# Patient Record
Sex: Female | Born: 1980 | Race: White | Hispanic: No | Marital: Married | Smoking: Never smoker
Health system: Southern US, Community
[De-identification: ages and names within clinical notes are randomized; demographics above are authoritative.]

---

## 2006-04-13 ENCOUNTER — Inpatient Hospital Stay (HOSPITAL_COMMUNITY): Admission: AD | Admit: 2006-04-13 | Discharge: 2006-04-16 | Payer: Self-pay | Admitting: Psychiatry

## 2006-04-13 ENCOUNTER — Ambulatory Visit: Payer: Self-pay | Admitting: Psychiatry

## 2007-02-15 ENCOUNTER — Ambulatory Visit: Payer: Self-pay | Admitting: Family Medicine

## 2007-02-27 ENCOUNTER — Ambulatory Visit: Payer: Self-pay | Admitting: Internal Medicine

## 2007-02-27 LAB — CONVERTED CEMR LAB
Alkaline Phosphatase: 59 units/L (ref 39–117)
BUN: 11 mg/dL (ref 6–23)
Eosinophils Absolute: 0 10*3/uL (ref 0.0–0.7)
Eosinophils Relative: 0 % (ref 0–5)
Glucose, Bld: 87 mg/dL (ref 70–99)
HCT: 40.9 % (ref 36.0–46.0)
HDL: 70 mg/dL (ref >39)
Hep B Core Total Ab: NEGATIVE
Hep B S Ab: POSITIVE — AB
LDL Cholesterol: 64 mg/dL (ref 0–99)
Lymphs Abs: 2.8 10*3/uL (ref 0.7–4.0)
MCHC: 31.5 g/dL (ref 30.0–36.0)
MCV: 91.1 fL (ref 78.0–100.0)
Platelets: 346 10*3/uL (ref 150–400)
Sodium: 141 meq/L (ref 135–145)
Total Bilirubin: 0.8 mg/dL (ref 0.3–1.2)
Total Protein: 7.5 g/dL (ref 6.0–8.3)
Triglycerides: 51 mg/dL (ref <150)
VLDL: 10 mg/dL (ref 0–40)
WBC: 8.3 10*3/uL (ref 4.0–10.5)

## 2007-04-08 ENCOUNTER — Ambulatory Visit: Payer: Self-pay | Admitting: Gastroenterology

## 2007-04-08 DIAGNOSIS — B182 Chronic viral hepatitis C: Secondary | ICD-10-CM | POA: Insufficient documentation

## 2007-05-24 ENCOUNTER — Telehealth: Payer: Self-pay | Admitting: Gastroenterology

## 2007-06-04 ENCOUNTER — Encounter: Payer: Self-pay | Admitting: Gastroenterology

## 2007-10-24 ENCOUNTER — Telehealth: Payer: Self-pay | Admitting: Gastroenterology

## 2007-11-28 ENCOUNTER — Ambulatory Visit: Payer: Self-pay | Admitting: Gastroenterology

## 2007-12-19 ENCOUNTER — Ambulatory Visit: Payer: Self-pay | Admitting: Gastroenterology

## 2008-05-07 ENCOUNTER — Ambulatory Visit: Payer: Self-pay | Admitting: Gastroenterology

## 2008-05-21 ENCOUNTER — Ambulatory Visit: Payer: Self-pay | Admitting: Gastroenterology

## 2008-05-21 ENCOUNTER — Encounter: Payer: Self-pay | Admitting: Gastroenterology

## 2008-06-04 ENCOUNTER — Encounter: Payer: Self-pay | Admitting: Gastroenterology

## 2008-06-04 ENCOUNTER — Ambulatory Visit: Payer: Self-pay | Admitting: Gastroenterology

## 2008-07-02 ENCOUNTER — Ambulatory Visit: Payer: Self-pay | Admitting: Gastroenterology

## 2008-08-18 ENCOUNTER — Ambulatory Visit: Payer: Self-pay | Admitting: Gastroenterology

## 2008-09-24 ENCOUNTER — Encounter: Payer: Self-pay | Admitting: Gastroenterology

## 2008-09-24 ENCOUNTER — Ambulatory Visit: Payer: Self-pay | Admitting: Gastroenterology

## 2009-05-11 ENCOUNTER — Other Ambulatory Visit: Admission: RE | Admit: 2009-05-11 | Discharge: 2009-05-11 | Payer: Self-pay | Admitting: Family Medicine

## 2010-03-09 ENCOUNTER — Other Ambulatory Visit: Payer: Self-pay | Admitting: Family Medicine

## 2010-03-09 ENCOUNTER — Ambulatory Visit
Admission: RE | Admit: 2010-03-09 | Discharge: 2010-03-09 | Disposition: A | Discharge status: Home / Self Care | Payer: 59 | Source: Ambulatory Visit | Attending: Family Medicine | Admitting: Family Medicine

## 2010-03-09 DIAGNOSIS — R262 Difficulty in walking, not elsewhere classified: Secondary | ICD-10-CM

## 2010-05-17 ENCOUNTER — Telehealth: Payer: Self-pay | Admitting: Gastroenterology

## 2010-05-17 NOTE — Telephone Encounter (Signed)
Returned pts call and she states that she figured out what she needed. Pt did not explain anything further.

## 2010-05-19 ENCOUNTER — Ambulatory Visit (INDEPENDENT_AMBULATORY_CARE_PROVIDER_SITE_OTHER): Payer: Self-pay | Admitting: Gastroenterology

## 2010-05-19 DIAGNOSIS — B182 Chronic viral hepatitis C: Secondary | ICD-10-CM

## 2010-05-24 ENCOUNTER — Other Ambulatory Visit: Payer: Self-pay | Admitting: Gastroenterology

## 2010-05-31 NOTE — Assessment & Plan Note (Signed)
[facility]                         GASTROENTEROLOGY OFFICE NOTE   NIMRAT, WOOLWORTH                     MRN:          [id number]  DATE:04/08/2007                            DOB:          March 26, 1980    REASON FOR CONSULTATION:  Hepatitis C.   Ms. Donna Bridges is a 30 year old white female referred by the [facility] and by [facility] for evaluation of hepatitis C.  Ms. Donna Bridges was tested for hepatitis because of her history of IV drug  use.  She is currently in a drug rehabilitation program.  She does not  drink.  There is no history of blood transfusions or liver disease.  Recent liver tests were entirely normal.   Past medical history is pertinent for depression and migraine headaches.   FAMILY HISTORY:  Noncontributory.   She is on no medicines.   She has no allergies.   She neither smokes nor drinks.  She is single and unemployed.   REVIEW OF SYSTEMS:  Positive for back pain and pain with her periods.   PHYSICAL EXAMINATION:  Pulse 88, blood pressure 98/44, weight 175.  There is no stigmata of liver disease.  HEENT: EOMI.  PERRLA.  Sclerae are anicteric.  Conjunctivae are pink.  NECK:  Supple without thyromegaly, adenopathy or carotid bruits.  CHEST:  Clear to auscultation and percussion without adventitious  sounds.  CARDIAC:  Regular rhythm; normal S1 S2.  There are no murmurs, gallops  or rubs.  ABDOMEN:  Bowel sounds are normoactive.  Abdomen is soft, nontender and  nondistended.  There are no abdominal masses, tenderness, splenic  enlargement or hepatomegaly.  EXTREMITIES:  Full range of motion.  No cyanosis, clubbing or edema.  RECTAL:  Deferred.   IMPRESSION:  Hepatitis C.  She clearly has no symptoms referable to  this.  At issue is whether she has active viral replication.   RECOMMENDATION:  Check HCV quantitative RNA.  If positive, I will refer  her to the hepatitis C clinic.     Barbette Hair. Arlyce Dice,  MD,FACG  Electronically Signed    RDK/MedQ  DD: 04/08/2007  DT: 04/08/2007  Job #: [id number]   cc:   Melvern Banker

## 2010-05-31 NOTE — Letter (Signed)
April 08, 2007    [employer] Luffman   RE:  RAIZEL, WESOLOWSKI  MRN:  [id number]  /  DOB:  Jan 14, 1981   Dear Ms. Luffman:   It is my pleasure to have treated you recently as a new patient in my  office.  I appreciate your confidence and the opportunity to participate  in your care.   Since I do have a busy inpatient endoscopy schedule and office schedule,  my office hours vary weekly.  I am, however, available for emergency  calls every day through my office.  If I cannot promptly meet an urgent  office appointment, another one of our gastroenterologists will be able  to assist you.   My well-trained staff are prepared to help you at all times.  For  emergencies after office hours, a physician from our gastroenterology  section is always available through my 24-hour answering service.   While you are under my care, I encourage discussion of your questions  and concerns, and I will be happy to return your calls as soon as I am  available.   Once again, I welcome you as a new patient and I look forward to a happy  and healthy relationship.    Sincerely,      Barbette Hair. Arlyce Dice, MD,FACG  Electronically Signed   RDK/MedQ  DD: 04/08/2007  DT: 04/08/2007  Job #: [id number]

## 2010-06-03 NOTE — Discharge Summary (Signed)
[name], Bridges NO.:  [id number]   MEDICAL RECORD NO.:  [id number]          PATIENT TYPE:  IPS   LOCATION:  0508                          FACILITY:  [facility]   PHYSICIAN:  Geoffery Lyons, M.D.      DATE OF BIRTH:  [birth year]   DATE OF ADMISSION:  04/13/2006  DATE OF DISCHARGE:  04/16/2006                               DISCHARGE SUMMARY   CHIEF COMPLAINT AND PRESENT ILLNESS:  This is the first admission to  [facility] for this 30 year old white female  voluntarily committed recently who is requesting detox from opiates,  revealing that the drug use parents were supportive using opiates.  Had  been using opiates for 2 years, on methadone 60 mg per day.  Stopped  methadone and now using OxyContin.  Has had history of IV drug use.  Used cocaine.  History of mood swings related to substance abuse.   PAST PSYCHIATRIC HISTORY:  No actual counseling.  Has been in the  methadone clinic in the past.  Two year history of opiate dependence.   ALCOHOL AND DRUG HISTORY:  Cocaine and marijuana.  Persistent use of  opiates.   MEDICAL HISTORY:  Migraine headaches.   MEDICATIONS:  1. Imitrex 50 mg.  2. Celexa 20 mg per day.  3. Verapamil.   PHYSICAL EXAMINATION:  Physical examination performed, failed to show  any acute findings.   LABORATORY WORK:  CBC:  White blood cells 9.3, hemoglobin 12.9.  Sodium  135, potassium 3.5, glucose 124, SGOT 14, SGPT 21, total bilirubin 0.5.  TSH 1.961.   MENTAL STATUS EXAM:  A fully alert, pleasant, cooperative female,  somewhat disheveled with prominent track marks.  Speech normal rate,  tone and production.  Mood depressed.  Affect depressed.  Thought  processes logical, coherent and relevant.  Endorsed suicidal ideation  planning to wreck her car.  Cognition well preserved.   DIAGNOSIS:  AXIS I: Opiate dependence, cocaine abuse, substance-induced  mood disorder.  AXIS II:  No diagnosis.  AXIS III: Migraine  headaches.  AXIS IV: Moderate.  AXIS V: Upon admission 35.  Highest global assessment of functioning in  the last year 60.   COURSE IN THE HOSPITAL:  She was admitted.  She was started in  individual and group psychotherapy.  Upon admission drug screen was  positive for cocaine, negative for opiates.  She was detoxed with  clonidine.  She was maintained on the Imitrex.  She was given Allegra.  She was given Zyprexa as needed.  Endorsed that she had been abusing  oxymorphone for 2 years, makes her feel high, gives her more energy, can  do more things every day.  Mood swings for 2 years.  Up to $300 per day,  $6000 to $7000 in debt.  Boyfriend in rehab at this particular time.  Has also done cocaine and marijuana.  Her primary care Donna Bridges gave her  Celexa.  Went to methadone clinic.  Was up to 60 mg per day October to  February.  She quit the methadone, went back on pills.  Boyfriend also  uses  opiates.  He was in detox and possibly rehab.  On April 14, 2006  she was agitated and anxious, burst into tears, unable to talk,  requesting help, agitated.  Feeling agitated thoughts and feelings.  Reflecting on how much she has hurt her parents, feeling that she was a  burden to them due to her addiction.  She was given doxycycline for her  skin lesions.  April 15, 2006 she was feeling better.  Slept through the  night.  On Celexa she did pretty well, addressing the irritability and  agitation, having taking it in more than a month.  Continued to evidence  some anxiety.  Some med seeking.  Required some Seroquel, Phenergan,  Bentyl and naproxen.  Would like to go into a 28-day program.  April 16, 2006 she was in full contact with reality.  Endorsed no active suicidal  or homicidal ideas, or hallucinations or delusions.  Wants to pursue  further outpatient treatment.  No acute withdrawal.  Committed to  abstinence.  If the boyfriend was not willing or able to get himself  clean, she was going  to leave him.  Family session with the mother and  the father.  They endorsed that they were supportive of her, but she  needed to change her lifestyle, terminate relationship with her friends  and her boyfriend.  The parents felt that the boyfriend was a negative  influence.  He apparently had stolen from the family.  It was clear that  she had to reestablish trust, but there were willing to be there for  her.   DISCHARGE DIAGNOSES:  AXIS I:  1. Opiate dependence.  2. Cocaine abuse.  3. Depressive disorder not otherwise specified.  AXIS II:  No diagnoses.  AXIS III:  1. Migraine headache.  2. Skin lesion.  AXIS IV: Moderate.  AXIS V:  On discharge 50-55.   Discharged on doxycycline 100 twice a day, Bactroban cream to lesions as  directed, Celexa 20 mg per day for follow-up for [facility].      Geoffery Lyons, M.D.  Electronically Signed     IL/MEDQ  D:  05/14/2006  T:  05/15/2006  Job:  [id number]

## 2014-10-25 ENCOUNTER — Emergency Department (HOSPITAL_BASED_OUTPATIENT_CLINIC_OR_DEPARTMENT_OTHER): Payer: BLUE CROSS/BLUE SHIELD

## 2014-10-25 ENCOUNTER — Emergency Department (HOSPITAL_BASED_OUTPATIENT_CLINIC_OR_DEPARTMENT_OTHER)
Admission: EM | Admit: 2014-10-25 | Discharge: 2014-10-25 | Disposition: A | Discharge status: Home / Self Care | Payer: BLUE CROSS/BLUE SHIELD | Attending: Emergency Medicine | Admitting: Emergency Medicine

## 2014-10-25 ENCOUNTER — Encounter (HOSPITAL_BASED_OUTPATIENT_CLINIC_OR_DEPARTMENT_OTHER): Payer: Self-pay | Admitting: Emergency Medicine

## 2014-10-25 DIAGNOSIS — Y9289 Other specified places as the place of occurrence of the external cause: Secondary | ICD-10-CM | POA: Diagnosis not present

## 2014-10-25 DIAGNOSIS — S99922A Unspecified injury of left foot, initial encounter: Secondary | ICD-10-CM | POA: Diagnosis present

## 2014-10-25 DIAGNOSIS — S92302A Fracture of unspecified metatarsal bone(s), left foot, initial encounter for closed fracture: Secondary | ICD-10-CM

## 2014-10-25 DIAGNOSIS — S92352A Displaced fracture of fifth metatarsal bone, left foot, initial encounter for closed fracture: Secondary | ICD-10-CM | POA: Diagnosis not present

## 2014-10-25 DIAGNOSIS — X58XXXA Exposure to other specified factors, initial encounter: Secondary | ICD-10-CM | POA: Insufficient documentation

## 2014-10-25 DIAGNOSIS — Y9389 Activity, other specified: Secondary | ICD-10-CM | POA: Diagnosis not present

## 2014-10-25 DIAGNOSIS — Y998 Other external cause status: Secondary | ICD-10-CM | POA: Insufficient documentation

## 2014-10-25 MED ORDER — OXYCODONE-ACETAMINOPHEN 5-325 MG PO TABS: 1.0000 | ORAL_TABLET | ORAL | Status: AC | PRN

## 2014-10-25 NOTE — Discharge Instructions (Signed)
Metatarsal Fracture A metatarsal fracture is a break in a metatarsal bone. Metatarsal bones connect your toe bones to your ankle bones. CAUSES This type of fracture may be caused by:  A sudden twisting of your foot.  A fall onto your foot.  Overuse or repetitive exercise. RISK FACTORS This condition is more likely to develop in people who:  Play contact sports.  Have a bone disease.  Have a low calcium level. SYMPTOMS Symptoms of this condition include:  Pain that is worse when walking or standing.  Pain when pressing on the foot or moving the toes.  Swelling.  Bruising on the top or bottom of the foot.  A foot that appears shorter than the other one. DIAGNOSIS This condition is diagnosed with a physical exam. You may also have imaging tests, such as:  X-rays.  A CT scan.  MRI. TREATMENT Treatment for this condition depends on its severity and whether a bone has moved out of place. Treatment may involve:  Rest.  Wearing foot support such as a cast, splint, or boot for several weeks.  Using crutches.  Surgery to move bones back into the right position. Surgery is usually needed if there are many pieces of broken bone or bones that are very out of place (displaced fracture).  Physical therapy. This may be needed to help you regain full movement and strength in your foot. You will need to return to your health care provider to have X-rays taken until your bones heal. Your health care provider will look at the X-rays to make sure that your foot is healing well. HOME CARE INSTRUCTIONS  If You Have a Cast:  Do not stick anything inside the cast to scratch your skin. Doing that increases your risk of infection.  Check the skin around the cast every day. Report any concerns to your health care provider. You may put lotion on dry skin around the edges of the cast. Do not apply lotion to the skin underneath the cast.  Keep the cast clean and dry. If You Have a Splint  or a Supportive Boot:  Wear it as directed by your health care provider. Remove it only as directed by your health care provider.  Loosen it if your toes become numb and tingle, or if they turn cold and blue.  Keep it clean and dry. Bathing  Do not take baths, swim, or use a hot tub until your health care provider approves. Ask your health care provider if you can take showers. You may only be allowed to take sponge baths for bathing.  If your health care provider approves bathing and showering, cover the cast or splint with a watertight plastic bag to protect it from water. Do not let the cast or splint get wet. Managing Pain, Stiffness, and Swelling  If directed, apply ice to the injured area (if you have a splint, not a cast).  Put ice in a plastic bag.  Place a towel between your skin and the bag.  Leave the ice on for 20 minutes, 2-3 times per day.  Move your toes often to avoid stiffness and to lessen swelling.  Raise (elevate) the injured area above the level of your heart while you are sitting or lying down. Driving  Do not drive or operate heavy machinery while taking pain medicine.  Do not drive while wearing foot support on a foot that you use for driving. Activity  Return to your normal activities as directed by your health care   provider. Ask your health care provider what activities are safe for you.  Perform exercises as directed by your health care provider or physical therapist. Safety  Do not use the injured foot to support your body weight until your health care provider says that you can. Use crutches as directed by your health care provider. General Instructions  Do not put pressure on any part of the cast or splint until it is fully hardened. This may take several hours.  Do not use any tobacco products, including cigarettes, chewing tobacco, or e-cigarettes. Tobacco can delay bone healing. If you need help quitting, ask your health care  provider.  Take medicines only as directed by your health care provider.  Keep all follow-up visits as directed by your health care provider. This is important. SEEK MEDICAL CARE IF:  You have a fever.  Your cast, splint, or boot is too loose or too tight.  Your cast, splint, or boot is damaged.  Your pain medicine is not helping.  You have pain, tingling, or numbness in your foot that is not going away. SEEK IMMEDIATE MEDICAL CARE IF:  You have severe pain.  You have tingling or numbness in your foot that is getting worse.  Your foot feels cold or becomes numb.  Your foot changes color.   This information is not intended to replace advice given to you by your health care provider. Make sure you discuss any questions you have with your health care provider.   Document Released: 09/24/2001 Document Revised: 05/19/2014 Document Reviewed: 10/29/2013 Elsevier Interactive Patient Education 2016 [employer]  

## 2014-10-25 NOTE — ED Notes (Signed)
Pt c/o pain and swelling to left foot after stepping of a curb wrong today

## 2014-10-25 NOTE — ED Provider Notes (Signed)
CSN: [id number]     Arrival date & time 10/25/14  1559 History  By signing my name below, I, Budd Palmer, attest that this documentation has been prepared under the direction and in the presence of Raeford Razor, MD. Electronically Signed: Budd Palmer, ED Scribe. 10/25/2014. 4:31 PM.     Chief Complaint  Patient presents with  . Foot Injury   The history is provided by the patient. No language interpreter was used.   HPI Comments: Donna Bridges is a 34 y.o. female who presents to the Emergency Department complaining of a painful injury to the left foot sustained this morning. Pt states she stepped off of a curb and twisted her foot. She reports associated constant pain and swelling to the top of the left foot. Can bear weight although with increased pain. No numbness or tingling. Denies pain anywhere else. No intervention prior to arrival.   History reviewed. No pertinent past medical history. Past Surgical History  Procedure Laterality Date  . Cesarean section     No family history on file. Social History  Substance Use Topics  . Smoking status: Never Smoker   . Smokeless tobacco: None  . Alcohol Use: Yes   OB History    No data available     Review of Systems  Skin: Negative for wound.  All other systems reviewed and are negative.   Allergies  Review of patient's allergies indicates no known allergies.  Home Medications   Prior to Admission medications   Not on File   BP 134/79 mmHg  Pulse 99  Temp(Src) 98.5 F (36.9 C) (Oral)  Resp 18  Ht  (1.727 m)  Wt 180 lb (81.647 kg)  BMI 27.38 kg/m2  SpO2 100%  LMP 10/18/2014 Physical Exam  Constitutional: She is oriented to person, place, and time. She appears well-developed and well-nourished.  HENT:  Head: Normocephalic.  Eyes: EOM are normal.  Neck: Normal range of motion.  Pulmonary/Chest: Effort normal.  Abdominal: She exhibits no distension.  Musculoskeletal: Normal range of motion. She  exhibits edema and tenderness.  TTP and selling over the 4th and 5th metacarpal of the left foot, closed injury, NVI  Neurological: She is alert and oriented to person, place, and time.  Psychiatric: She has a normal mood and affect.  Nursing note and vitals reviewed.   ED Course  Procedures  DIAGNOSTIC STUDIES: Oxygen Saturation is 100% on RA, normal by my interpretation.    COORDINATION OF CARE: 4:21 PM - Discussed probable fracture. Discussed plans to order diagnostic imaging. Pt advised of plan for treatment and pt agrees.  Labs Review Labs Reviewed - No data to display  Imaging Review Dg Foot Complete Left  10/25/2014   CLINICAL DATA:  Left foot pain, status post fall.  EXAM: LEFT FOOT - COMPLETE 3+ VIEW  COMPARISON:  None.  FINDINGS: There is a minimally displaced comminuted fracture of the base of the fifth metatarsal bone, with associated soft tissue swelling. There is an intra-articular extension to the articulation with the cuboid. There remainder of the osseous structures are normal.  IMPRESSION: Intraarticular comminuted fracture of the base of the left fifth metatarsal bone.   Electronically Signed   By: Ted Mcalpine M.D.   On: 10/25/2014 17:20   I have personally reviewed and evaluated these images and lab results as part of my medical decision-making.   EKG Interpretation None      MDM   Final diagnoses:  Fracture of fifth metatarsal bone,  left, closed, initial encounter    34 year old female with left lateral foot pain. Imaging significant for fracture at the base of the fifth metatarsal. Closed injury. Neurovascularly intact. Will place in splint. Crutches. As needed pain medication. Orthopedic follow-up.It has been determined that no acute conditions requiring further emergency intervention are present at this time. The patient has been advised of the diagnosis and plan. I reviewed any labs and imaging including any potential incidental findings. We have  discussed signs and symptoms that warrant return to the ED and they are listed in the discharge instructions.   I personally preformed the services scribed in my presence. The recorded information has been reviewed is accurate. Raeford Razor, MD.   Raeford Razor, MD 10/25/14 [phone]

## 2016-05-10 IMAGING — DX DG FOOT COMPLETE 3+V*L*
3 series · 3 of 3 positions shown · non-contrast
Comparison: None.

CLINICAL DATA: Left foot pain, status post fall.

EXAM:
LEFT FOOT - COMPLETE 3+ VIEW

[foot ap]
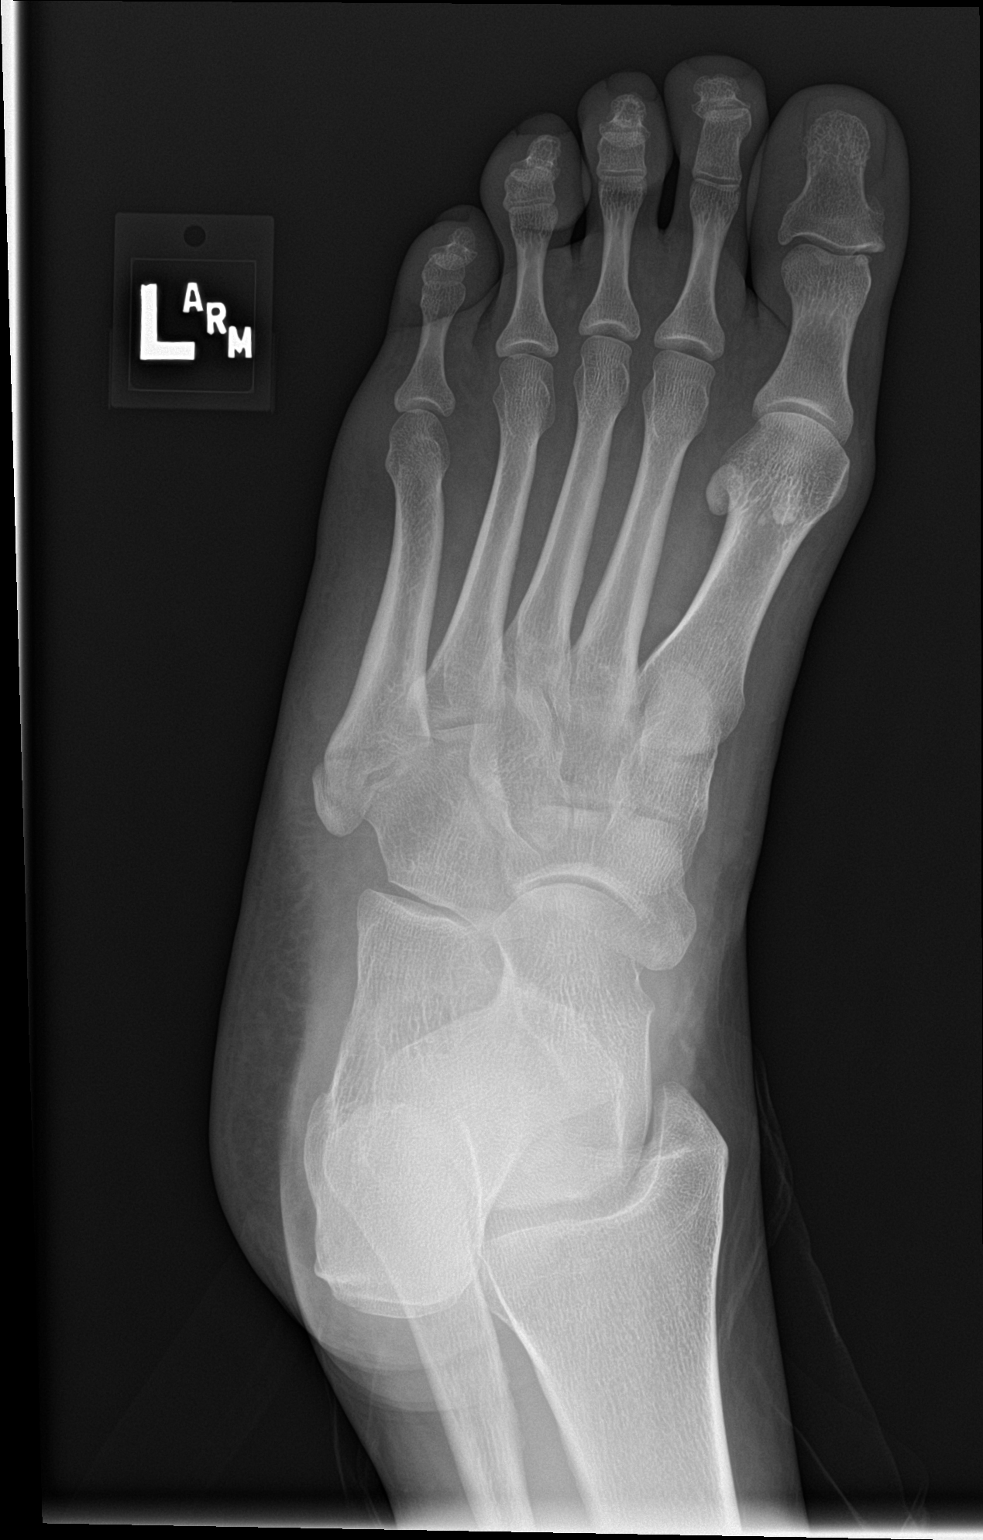

[foot obl]
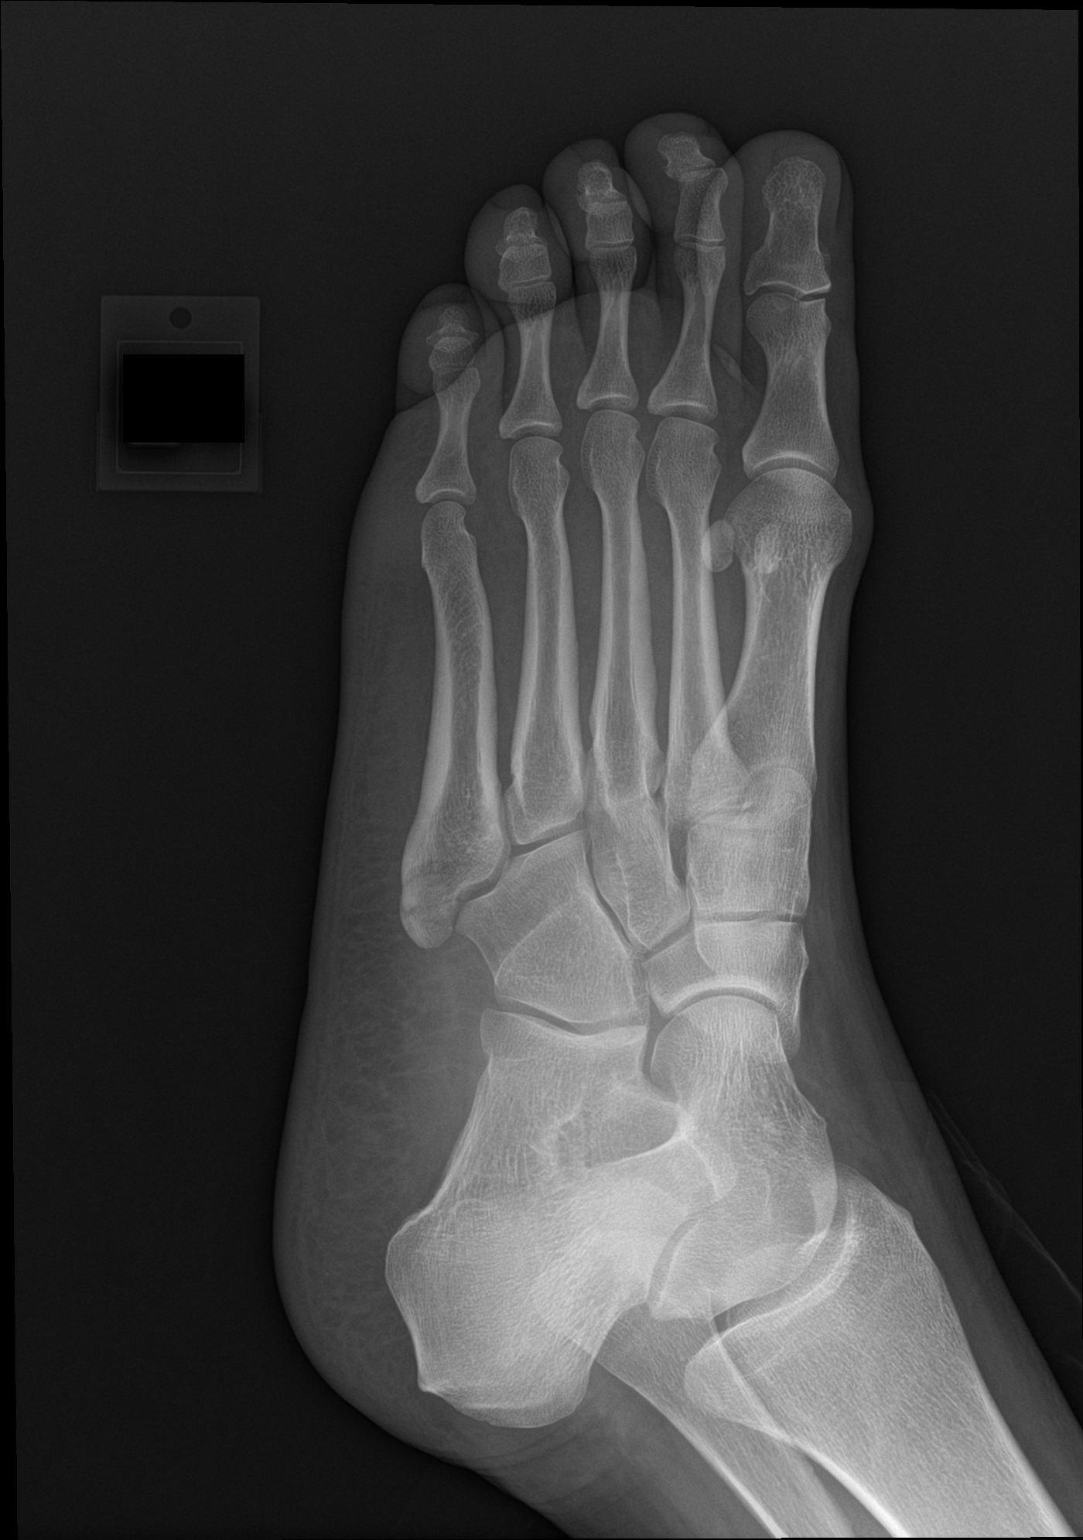

[foot lat]
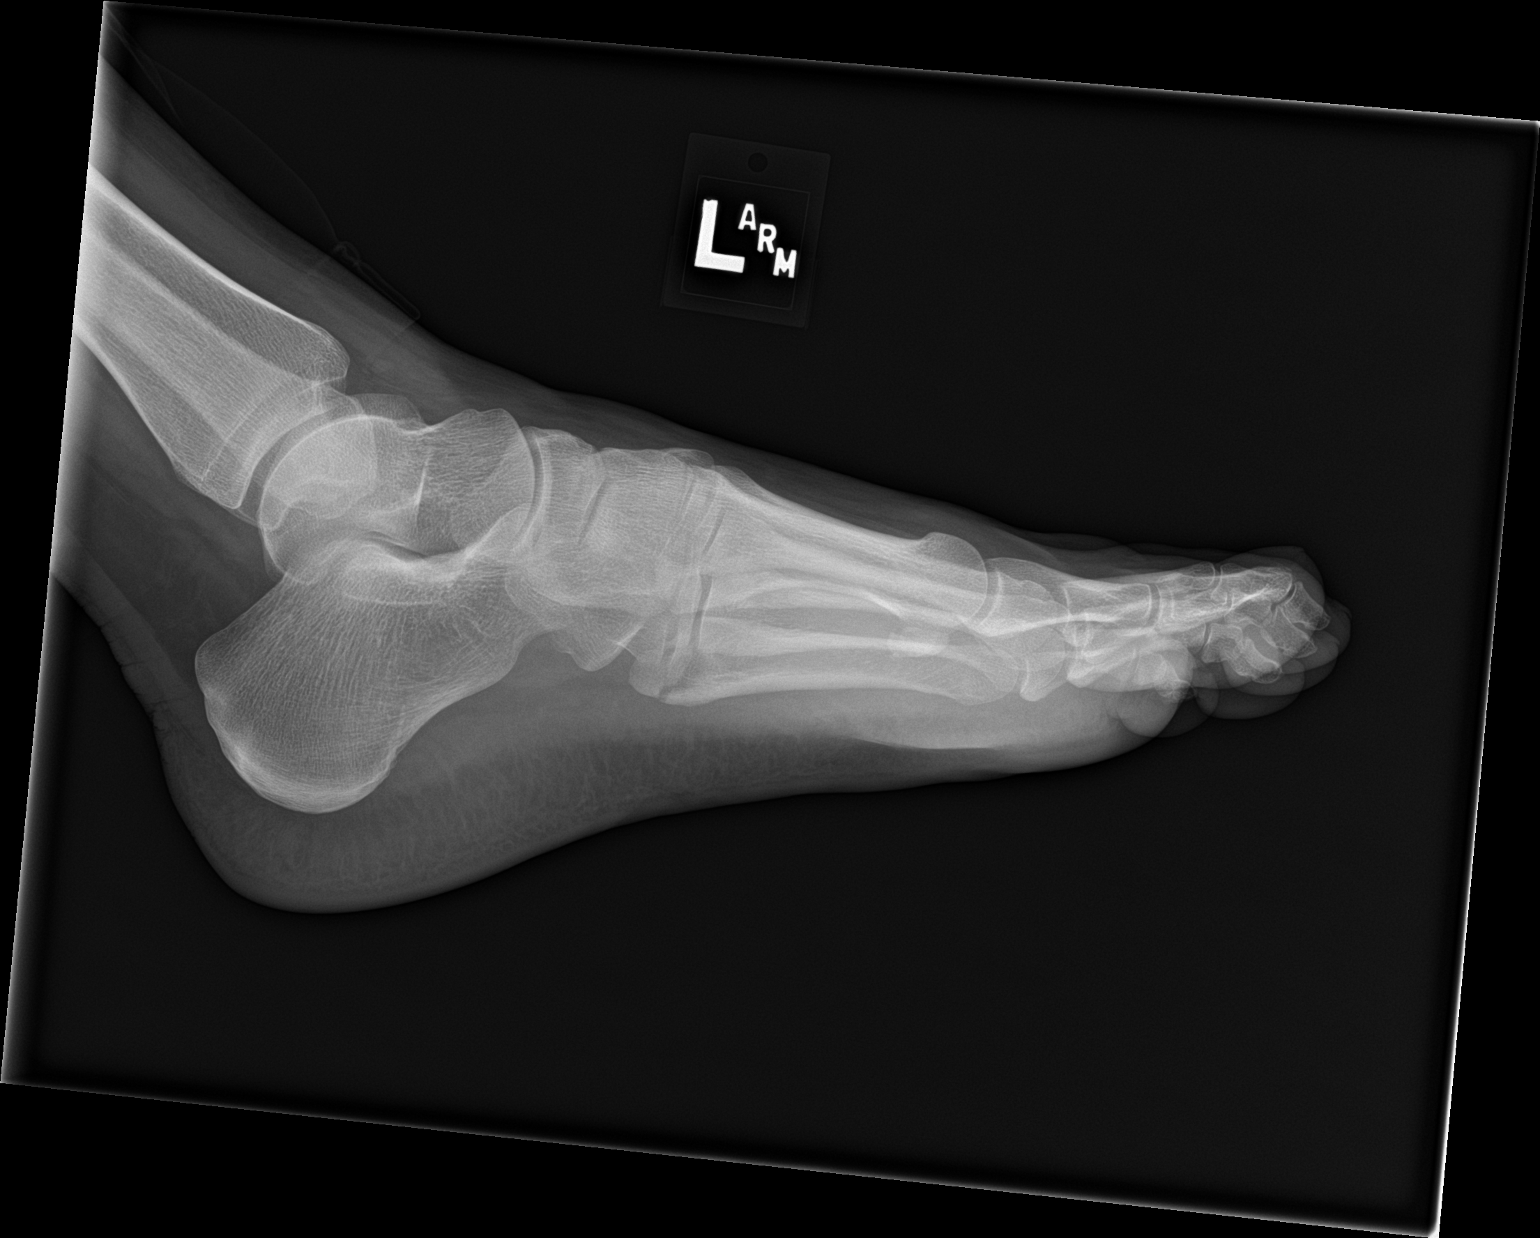

[3 of 3 positions shown; findings below may reference images not displayed]

FINDINGS: There is a minimally displaced comminuted fracture of the base of
the fifth metatarsal bone, with associated soft tissue swelling.
There is an intra-articular extension to the articulation with the
cuboid. There remainder of the osseous structures are normal.
IMPRESSION: Intraarticular comminuted fracture of the base of the left fifth
metatarsal bone.
